# Patient Record
Sex: Male | Born: 1980 | Race: Black or African American | Hispanic: No | Marital: Single | Smoking: Never smoker
Health system: Southern US, Community
[De-identification: ages and names within clinical notes are randomized; demographics above are authoritative.]

## PROBLEM LIST (undated history)

## (undated) DIAGNOSIS — I10 Essential (primary) hypertension: Secondary | ICD-10-CM

---

## 2014-12-11 ENCOUNTER — Emergency Department (HOSPITAL_COMMUNITY)
Admission: EM | Admit: 2014-12-11 | Discharge: 2014-12-11 | Disposition: A | Discharge status: Home / Self Care | Payer: BC Managed Care – PPO | Attending: Emergency Medicine | Admitting: Emergency Medicine

## 2014-12-11 ENCOUNTER — Emergency Department (HOSPITAL_COMMUNITY): Payer: BC Managed Care – PPO

## 2014-12-11 ENCOUNTER — Encounter (HOSPITAL_COMMUNITY): Payer: Self-pay | Admitting: Emergency Medicine

## 2014-12-11 DIAGNOSIS — J069 Acute upper respiratory infection, unspecified: Secondary | ICD-10-CM | POA: Insufficient documentation

## 2014-12-11 DIAGNOSIS — R0602 Shortness of breath: Secondary | ICD-10-CM | POA: Diagnosis present

## 2014-12-11 LAB — BASIC METABOLIC PANEL
Anion gap: 5 (ref 5–15)
BUN: 13 mg/dL (ref 6–23)
CALCIUM: 9.5 mg/dL (ref 8.4–10.5)
CHLORIDE: 103 mmol/L (ref 96–112)
CO2: 28 mmol/L (ref 19–32)
CREATININE: 1.21 mg/dL (ref 0.50–1.35)
GFR calc non Af Amer: 78 mL/min — ABNORMAL LOW (ref >90)
GLUCOSE: 111 mg/dL — AB (ref 70–99)
Potassium: 3.6 mmol/L (ref 3.5–5.1)
Sodium: 136 mmol/L (ref 135–145)

## 2014-12-11 LAB — CBC WITH DIFFERENTIAL/PLATELET
BASOS ABS: 0 10*3/uL (ref 0.0–0.1)
BASOS PCT: 0 % (ref 0–1)
EOS PCT: 6 % — AB (ref 0–5)
Eosinophils Absolute: 0.5 10*3/uL (ref 0.0–0.7)
HCT: 43.5 % (ref 39.0–52.0)
HEMOGLOBIN: 14.4 g/dL (ref 13.0–17.0)
Lymphocytes Relative: 26 % (ref 12–46)
Lymphs Abs: 2.3 10*3/uL (ref 0.7–4.0)
MCH: 28.8 pg (ref 26.0–34.0)
MCHC: 33.1 g/dL (ref 30.0–36.0)
MCV: 87 fL (ref 78.0–100.0)
MONOS PCT: 4 % (ref 3–12)
Monocytes Absolute: 0.4 10*3/uL (ref 0.1–1.0)
Neutro Abs: 5.5 10*3/uL (ref 1.7–7.7)
Neutrophils Relative %: 64 % (ref 43–77)
Platelets: 228 10*3/uL (ref 150–400)
RBC: 5 MIL/uL (ref 4.22–5.81)
RDW: 13.7 % (ref 11.5–15.5)
WBC: 8.6 10*3/uL (ref 4.0–10.5)

## 2014-12-11 LAB — I-STAT TROPONIN, ED: Troponin i, poc: 0 ng/mL (ref 0.00–0.08)

## 2014-12-11 LAB — BRAIN NATRIURETIC PEPTIDE: B Natriuretic Peptide: 15.9 pg/mL (ref 0.0–100.0)

## 2014-12-11 NOTE — ED Notes (Signed)
Pt c/o SOB after playing basketball yesterday; pt sts some fatigue today and sts some sinus congestion

## 2014-12-11 NOTE — Discharge Instructions (Signed)
Upper Respiratory Infection, Adult An upper respiratory infection (URI) is also sometimes known as the common cold. The upper respiratory tract includes the nose, sinuses, throat, trachea, and bronchi. Bronchi are the airways leading to the lungs. Most people improve within 1 week, but symptoms can last up to 2 weeks. A residual cough may last even longer.  CAUSES Many different viruses can infect the tissues lining the upper respiratory tract. The tissues become irritated and inflamed and often become very moist. Mucus production is also common. A cold is contagious. You can easily spread the virus to others by oral contact. This includes kissing, sharing a glass, coughing, or sneezing. Touching your mouth or nose and then touching a surface, which is then touched by another person, can also spread the virus. SYMPTOMS  Symptoms typically develop 1 to 3 days after you come in contact with a cold virus. Symptoms vary from person to person. They may include:  Runny nose.  Sneezing.  Nasal congestion.  Sinus irritation.  Sore throat.  Loss of voice (laryngitis).  Cough.  Fatigue.  Muscle aches.  Loss of appetite.  Headache.  Low-grade fever. DIAGNOSIS  You might diagnose your own cold based on familiar symptoms, since most people get a cold 2 to 3 times a year. Your caregiver can confirm this based on your exam. Most importantly, your caregiver can check that your symptoms are not due to another disease such as strep throat, sinusitis, pneumonia, asthma, or epiglottitis. Blood tests, throat tests, and X-rays are not necessary to diagnose a common cold, but they may sometimes be helpful in excluding other more serious diseases. Your caregiver will decide if any further tests are required. RISKS AND COMPLICATIONS  You may be at risk for a more severe case of the common cold if you smoke cigarettes, have chronic heart disease (such as heart failure) or lung disease (such as asthma), or if  you have a weakened immune system. The very young and very old are also at risk for more serious infections. Bacterial sinusitis, middle ear infections, and bacterial pneumonia can complicate the common cold. The common cold can worsen asthma and chronic obstructive pulmonary disease (COPD). Sometimes, these complications can require emergency medical care and may be life-threatening. PREVENTION  The best way to protect against getting a cold is to practice good hygiene. Avoid oral or hand contact with people with cold symptoms. Wash your hands often if contact occurs. There is no clear evidence that vitamin C, vitamin E, echinacea, or exercise reduces the chance of developing a cold. However, it is always recommended to get plenty of rest and practice good nutrition. TREATMENT  Treatment is directed at relieving symptoms. There is no cure. Antibiotics are not effective, because the infection is caused by a virus, not by bacteria. Treatment may include:  Increased fluid intake. Sports drinks offer valuable electrolytes, sugars, and fluids.  Breathing heated mist or steam (vaporizer or shower).  Eating chicken soup or other clear broths, and maintaining good nutrition.  Getting plenty of rest.  Using gargles or lozenges for comfort.  Controlling fevers with ibuprofen or acetaminophen as directed by your caregiver.  Increasing usage of your inhaler if you have asthma. Zinc gel and zinc lozenges, taken in the first 24 hours of the common cold, can shorten the duration and lessen the severity of symptoms. Pain medicines may help with fever, muscle aches, and throat pain. A variety of non-prescription medicines are available to treat congestion and runny nose. Your caregiver   can make recommendations and may suggest nasal or lung inhalers for other symptoms.  HOME CARE INSTRUCTIONS   Only take over-the-counter or prescription medicines for pain, discomfort, or fever as directed by your  caregiver.  Use a warm mist humidifier or inhale steam from a shower to increase air moisture. This may keep secretions moist and make it easier to breathe.  Drink enough water and fluids to keep your urine clear or pale yellow.  Rest as needed.  Return to work when your temperature has returned to normal or as your caregiver advises. You may need to stay home longer to avoid infecting others. You can also use a face mask and careful hand washing to prevent spread of the virus. SEEK MEDICAL CARE IF:   After the first few days, you feel you are getting worse rather than better.  You need your caregiver's advice about medicines to control symptoms.  You develop chills, worsening shortness of breath, or brown or red sputum. These may be signs of pneumonia.  You develop yellow or brown nasal discharge or pain in the face, especially when you bend forward. These may be signs of sinusitis.  You develop a fever, swollen neck glands, pain with swallowing, or white areas in the back of your throat. These may be signs of strep throat. SEEK IMMEDIATE MEDICAL CARE IF:   You have a fever.  You develop severe or persistent headache, ear pain, sinus pain, or chest pain.  You develop wheezing, a prolonged cough, cough up blood, or have a change in your usual mucus (if you have chronic lung disease).  You develop sore muscles or a stiff neck. Document Released: 03/11/2001 Document Revised: 12/08/2011 Document Reviewed: 12/21/2013 Exit[employer] Patient Information 2015 [employer]. This information is not intended to replace advice given to you by your health care provider. Make sure you discuss any questions you have with your health care provider.  

## 2014-12-11 NOTE — ED Provider Notes (Signed)
CSN: [id number]     Arrival date & time 12/11/14  1035 History   First MD Initiated Contact with Patient 12/11/14 1219     Chief Complaint  Patient presents with  . Shortness of Breath  . Fatigue     (Consider location/radiation/quality/duration/timing/severity/associated sxs/prior Treatment) Patient is a 34 y.o. male presenting with URI. The history is provided by the patient.  URI Presenting symptoms: congestion, cough and fatigue   Presenting symptoms: no fever and no sore throat   Severity:  Moderate Onset quality:  Gradual Duration:  3 days Timing:  Constant Progression:  Worsening Chronicity:  New Relieved by:  None tried Exacerbated by: exertion. Ineffective treatments:  None tried Associated symptoms: no arthralgias, no myalgias and no wheezing   Risk factors: no chronic cardiac disease, no chronic kidney disease, no chronic respiratory disease and no diabetes mellitus     History reviewed. No pertinent past medical history. History reviewed. No pertinent past surgical history. History reviewed. No pertinent family history. History  Substance Use Topics  . Smoking status: Never Smoker   . Smokeless tobacco: Not on file  . Alcohol Use: No    Review of Systems  Constitutional: Positive for fatigue. Negative for fever.  HENT: Positive for congestion. Negative for sore throat.   Respiratory: Positive for cough. Negative for wheezing.   Musculoskeletal: Negative for myalgias and arthralgias.  All other systems reviewed and are negative.     Allergies  Shellfish allergy  Home Medications   Prior to Admission medications   Medication Sig Start Date End Date Taking? Authorizing Provider  ibuprofen (ADVIL,MOTRIN) 200 MG tablet Take 400 mg by mouth every 6 (six) hours as needed for mild pain or moderate pain.   Yes Historical Provider, MD   BP 127/79 mmHg  Pulse 95  Temp(Src) 98.7 F (37.1 C) (Oral)  Resp 17  Ht  (1.905 m)  Wt 299 lb 1 oz (135.654 kg)   BMI 37.38 kg/m2  SpO2 95% Physical Exam  Constitutional: He is oriented to person, place, and time. He appears well-developed and well-nourished. No distress.  HENT:  Head: Normocephalic and atraumatic.  Mouth/Throat: Oropharynx is clear and moist. No oropharyngeal exudate.  Nasal congestion with nasal voice. TMs clear bilaterally  Eyes: Conjunctivae and EOM are normal. Pupils are equal, round, and reactive to light.  Neck: Normal range of motion. Neck supple.  Cardiovascular: Normal rate, regular rhythm, normal heart sounds and intact distal pulses.  Exam reveals no gallop and no friction rub.   No murmur heard. Pulmonary/Chest: Effort normal and breath sounds normal. No respiratory distress. He has no wheezes. He has no rales.  Abdominal: Soft. He exhibits no distension and no mass. There is no tenderness. There is no rebound and no guarding.  Musculoskeletal: Normal range of motion. He exhibits no edema or tenderness.  Lymphadenopathy:    He has cervical adenopathy (non-tender).  Neurological: He is alert and oriented to person, place, and time. No cranial nerve deficit.  Skin: Skin is warm and dry. No rash noted. He is not diaphoretic.  Psychiatric: He has a normal mood and affect. His behavior is normal. Judgment and thought content normal.  Nursing note and vitals reviewed.   ED Course  Procedures (including critical care time) Labs Review Labs Reviewed  BASIC METABOLIC PANEL - Abnormal; Notable for the following:    Glucose, Bld 111 (*)    GFR calc non Af Amer 78 (*)    All other components within normal  limits  CBC WITH DIFFERENTIAL/PLATELET - Abnormal; Notable for the following:    Eosinophils Relative 6 (*)    All other components within normal limits  BRAIN NATRIURETIC PEPTIDE  I-STAT TROPOININ, ED    Imaging Review Dg Chest 2 View  12/11/2014   CLINICAL DATA:  Shortness of breath since yesterday.  EXAM: CHEST  2 VIEW  COMPARISON:  None.  FINDINGS: The heart size  and mediastinal contours are within normal limits. Both lungs are clear. No pneumothorax or pleural effusion is noted. The visualized skeletal structures are unremarkable.  IMPRESSION: No active cardiopulmonary disease.   Electronically Signed   By: Lupita RaiderJames  Green Jr, M.D.   On: 12/11/2014 12:24     EKG Interpretation   Date/Time:  Monday December 11 2014 11:21:59 EDT Ventricular Rate:  88 PR Interval:  152 QRS Duration: 94 QT Interval:  370 QTC Calculation: 447 R Axis:   82 Text Interpretation:  Normal sinus rhythm Normal ECG Confirmed by WARD,   DO, KRISTEN ([id number]) on 12/11/2014 12:52:10 PM      MDM   Final diagnoses:  Viral URI   34 year old male presents with URI symptoms and shortness of breath. Increased fatigue and shortness of breath with minimal exertion for the last 3 days. Also with sinus congestion, mild cough. Denies chest pain, worsening swelling. Denies fevers.  On arrival the patient is afebrile and hemodynamically stable. Lungs are clear to auscultation bilaterally has no increased work of breathing or hypoxia. Presentation is consistent with a viral URI. Workup obtained prior to my examination with a negative chest x-ray, EKG was normal sinus rhythm and no ischemic changes, and basic labs including troponin all within normal limits. Presentation is not concerning for emergent etiology due to the shortness of breath and more likely secondary to viral URI. We will treat symptomatically and refer back to PCP for further evaluation if symptoms persist following this illness.    Dorna LeitzAlex Ariyel Jeangilles, MD 12/11/14 1314  Layla MawKristen N Ward, DO 12/11/14 [phone]

## 2017-01-29 ENCOUNTER — Encounter (HOSPITAL_COMMUNITY): Payer: Self-pay

## 2017-01-29 ENCOUNTER — Emergency Department (HOSPITAL_COMMUNITY)
Admission: EM | Admit: 2017-01-29 | Discharge: 2017-01-29 | Disposition: A | Discharge status: Home / Self Care | Payer: BC Managed Care – PPO | Attending: Emergency Medicine | Admitting: Emergency Medicine

## 2017-01-29 ENCOUNTER — Emergency Department (HOSPITAL_COMMUNITY): Payer: BC Managed Care – PPO

## 2017-01-29 DIAGNOSIS — R200 Anesthesia of skin: Secondary | ICD-10-CM | POA: Diagnosis present

## 2017-01-29 LAB — CBC WITH DIFFERENTIAL/PLATELET
Basophils Absolute: 0 10*3/uL (ref 0.0–0.1)
Basophils Relative: 0 %
EOS PCT: 3 %
Eosinophils Absolute: 0.2 10*3/uL (ref 0.0–0.7)
HCT: 40.5 % (ref 39.0–52.0)
Hemoglobin: 13.3 g/dL (ref 13.0–17.0)
LYMPHS PCT: 30 %
Lymphs Abs: 2 10*3/uL (ref 0.7–4.0)
MCH: 28.9 pg (ref 26.0–34.0)
MCHC: 32.8 g/dL (ref 30.0–36.0)
MCV: 87.9 fL (ref 78.0–100.0)
MONO ABS: 0.5 10*3/uL (ref 0.1–1.0)
MONOS PCT: 7 %
Neutro Abs: 4.2 10*3/uL (ref 1.7–7.7)
Neutrophils Relative %: 60 %
Platelets: 246 10*3/uL (ref 150–400)
RBC: 4.61 MIL/uL (ref 4.22–5.81)
RDW: 13.2 % (ref 11.5–15.5)
WBC: 6.9 10*3/uL (ref 4.0–10.5)

## 2017-01-29 LAB — BASIC METABOLIC PANEL
Anion gap: 8 (ref 5–15)
BUN: 10 mg/dL (ref 6–20)
CO2: 25 mmol/L (ref 22–32)
Calcium: 9.4 mg/dL (ref 8.9–10.3)
Chloride: 105 mmol/L (ref 101–111)
Creatinine, Ser: 1.3 mg/dL — ABNORMAL HIGH (ref 0.61–1.24)
GFR calc Af Amer: >60 mL/min (ref >60)
GLUCOSE: 80 mg/dL (ref 65–99)
Potassium: 3.7 mmol/L (ref 3.5–5.1)
Sodium: 138 mmol/L (ref 135–145)

## 2017-01-29 MED ORDER — LORAZEPAM 1 MG PO TABS: 1.0000 mg | ORAL_TABLET | Freq: Once | ORAL | Status: DC

## 2017-01-29 NOTE — ED Triage Notes (Signed)
Pt reports he was writing this morning and felt weakness/numbness in his right arm. No unilateral weakness noted. Pt reports he has had recent stress.

## 2017-01-29 NOTE — ED Notes (Signed)
Patient transported to CT 

## 2017-01-29 NOTE — Discharge Instructions (Signed)
CT and MRI are normal. Your kidney function is mildly elevated. Make sure you drink plenty of fluids. Follow up with PCP tomorrow as discussed. Have given him a referral to neurology. Return to the ED if he develops any worsening symptoms.

## 2017-01-29 NOTE — ED Provider Notes (Signed)
[facility]-EMERGENCY DEPT Provider Note   CSN: [id number] Arrival date & time: 01/29/17  1314     History   Chief Complaint Chief Complaint  Patient presents with  . Numbness    HPI Marc Gibson is a 36 y.o. male.  HPI 36 year old African-American male with no significant past medical history presents to the ED today with complaints of right arm tightness, numbness, weakness. The patient states that he is currently under a lot of stress at work. Has had several stressful events this week. 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 States that it felt numb and was shaking. Patient states that he stopped and thought that it was self resolved. Had one of his colleagues watch him right and again noticed that his hand was tremorous. Patient reports that his arm feels "tight". Denies any tingling. Patient states he's been having intermittent headaches for the past couple of days. Denies maximal or acute onset. They have self resolved. Denies any headache today. Denies any vision changes. Denies any other symptoms including difficulties with speech, facial asymmetry, difficulties walking. Does have a history of headaches. Wife at bedside was concern for possible stroke or complex migraines. Patient denies any other complaints at this time. History reviewed. No pertinent past medical history.  There are no active problems to display for this patient.   History reviewed. No pertinent surgical history.     Home Medications    Prior to Admission medications   Medication Sig Start Date End Date Taking? Authorizing Provider  ibuprofen (ADVIL,MOTRIN) 200 MG tablet Take 400 mg by mouth every 6 (six) hours as needed for mild pain or moderate pain.    Historical Provider, MD    Family History History reviewed. No pertinent family history.  Social History Social History  Substance Use Topics  . Smoking status: Never  Smoker  . Smokeless tobacco: Never Used  . Alcohol use No     Allergies   Shellfish allergy   Review of Systems Review of Systems  HENT: Negative for congestion.   Respiratory: Negative for cough and shortness of breath.   Cardiovascular: Negative for chest pain.  Gastrointestinal: Negative for abdominal pain, diarrhea, nausea and vomiting.  Skin: Negative.   Neurological: Positive for weakness, numbness and headaches. Negative for dizziness and light-headedness.     Physical Exam Updated Vital Signs BP (!) 150/106   Pulse 66   Temp 98.1 F (36.7 C) (Oral)   Resp 16   Ht 6\' 3"  (1.905 m)   Wt 129.3 kg   SpO2 99%   BMI 35.62 kg/m   Physical Exam  Constitutional: He is oriented to person, place, and time. He appears well-developed and well-nourished. No distress.  Patient appears anxious. Nontoxic-appearing. No acute distress. Resting comfortably on the bed.  HENT:  Head: Normocephalic and atraumatic.  Mouth/Throat: Oropharynx is clear and moist.  Eyes: Conjunctivae and EOM are normal. Pupils are equal, round, and reactive to light. Right eye exhibits no discharge. Left eye exhibits no discharge. No scleral icterus.  Neck: Normal range of motion. Neck supple. No thyromegaly present.  Cardiovascular: Normal rate, regular rhythm, normal heart sounds and intact distal pulses.  Exam reveals no gallop and no friction rub.   No murmur heard. Pulmonary/Chest: Effort normal and breath sounds normal. No respiratory distress. He has no rales. He exhibits no tenderness.  Abdominal: Soft. Bowel sounds are normal. He exhibits no distension. There  is no tenderness. There is no rebound and no guarding.  Musculoskeletal: Normal range of motion.  Lymphadenopathy:    He has no cervical adenopathy.  Neurological: He is alert and oriented to person, place, and time.  The patient is alert, attentive, and oriented x 3. Speech is clear. Cranial nerve II-VII grossly intact. Negative pronator  drift. Sensation intact. Strength 5/5 in all extremities. Reflexes 2+ and symmetric at biceps, triceps, knees, and ankles. Rapid alternating movement and fine finger movements intact. Romberg is absent. Posture and gait normal.  Proprioception are intact. Intact to sharp dull. Point discrimination normal.   Skin: Skin is warm and dry. Capillary refill takes less than 2 seconds.  Psychiatric: His mood appears anxious.  Nursing note and vitals reviewed.    ED Treatments / Results  Labs (all labs ordered are listed, but only abnormal results are displayed) Labs Reviewed  BASIC METABOLIC PANEL - Abnormal; Notable for the following:       Result Value   Creatinine, Ser 1.30 (*)    All other components within normal limits  CBC WITH DIFFERENTIAL/PLATELET    EKG  EKG Interpretation None       Radiology Ct Head Wo Contrast  Result Date: 01/29/2017 CLINICAL DATA:  Weakness and numbness in the right arm EXAM: CT HEAD WITHOUT CONTRAST TECHNIQUE: Contiguous axial images were obtained from the base of the skull through the vertex without intravenous contrast. COMPARISON:  None. FINDINGS: Brain: No evidence of acute infarction, hemorrhage, hydrocephalus, extra-axial collection or mass lesion/mass effect. Vascular: No hyperdense vessel or unexpected calcification. Skull: Normal. Negative for fracture or focal lesion. Sinuses/Orbits: No acute finding. Other: None. IMPRESSION: No acute abnormality noted. Electronically Signed   By: Alcide Clever M.D.   On: 01/29/2017 14:35    Procedures Procedures (including critical care time)  Medications Ordered in ED Medications  LORazepam (ATIVAN) tablet 1 mg (1 mg Oral Refused 01/29/17 1517)     Initial Impression / Assessment and Plan / ED Course  I have reviewed the triage vital signs and the nursing notes.  Pertinent labs & imaging results that were available during my care of the patient were reviewed by me and considered in my medical decision  making (see chart for details).     The patient resents to the ED with complaints of right arm numbness/tightness with intermittent headaches. Patient is neurovascularly intact. No focal neuro deficits noted. CT of head was normal. Labs are baseline. Creatinine elevated at 1.3 which seems to be at baseline for patient. Last creatinine was 1.24. Patient able to tolerate by mouth fluids. Had been given by mouth fluids in the ED. Will need follow-up with PCP for recheck of creatinine. Offer Ativan to patient due to his  anxiety however he and wife refused. Given patient's symptoms MRI was ordered. MRI returned that showed no acute abnormalities. So the patient's symptoms are likely due to anxiety versus complex migraines. We'll give follow-up to neurology. All symptoms have resolved. No ongoing symptoms. Encouraged follow-up with PCP for kidney function rechecked and to increase fluid intake. Pt is hemodynamically stable, in NAD, & able to ambulate in the ED. Pain has been managed & pt has no complaints prior to dc. Pt is comfortable with above plan and is stable for discharge at this time. All questions were answered prior to disposition. Strict return precautions for f/u to the ED were discussed. Patient discussed with Dr. Juleen China who is agreeable to above plan. Patient states she'll follow up with  his primary care doctor tomorrow.  Final Clinical Impressions(s) / ED Diagnoses   Final diagnoses:  Right arm numbness    New Prescriptions New Prescriptions   No medications on file     Rise MuKenneth T Tranesha Lessner, PA-C 01/29/17 1630    Raeford RazorKohut, Stephen, MD 02/02/17 [phone]

## 2017-01-29 NOTE — ED Notes (Signed)
Pt returned from MRI °

## 2018-11-02 IMAGING — CT CT HEAD W/O CM
4 series · 16 of 47 positions shown, 18 images · non-contrast
Comparison: None.

CLINICAL DATA: Weakness and numbness in the right arm

EXAM:
CT HEAD WITHOUT CONTRAST
TECHNIQUE: Contiguous axial images were obtained from the base of the skull
through the vertex without intravenous contrast.

[Series 3: head without · axial · non-contrast · 0.45mm/px · z∈[-43,+77]mm · 7 of 32 slices shown, 9 images]
[im 4/32  brain]
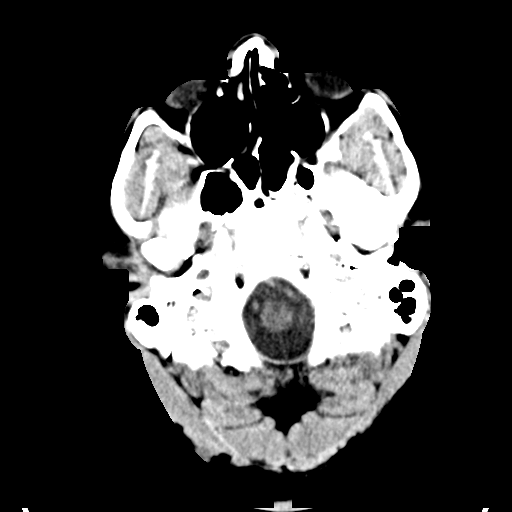
[im 4/32  bone]
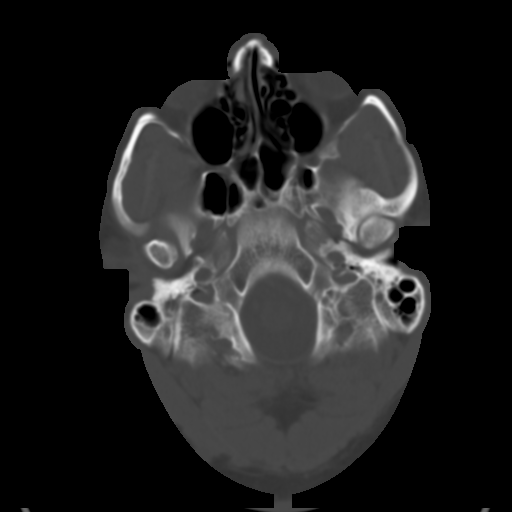
[im 8/32  brain]
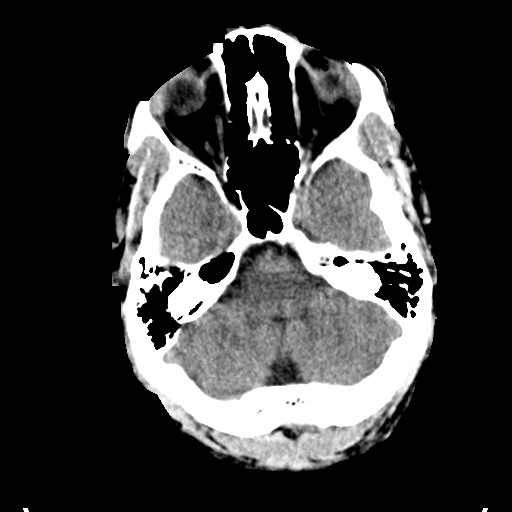
[im 12/32  brain]
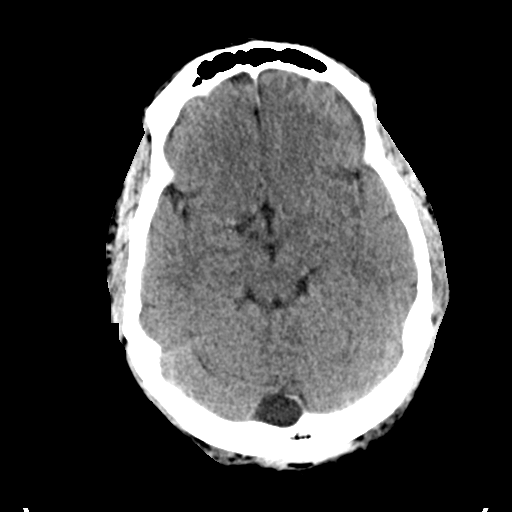
[im 16/32  brain]
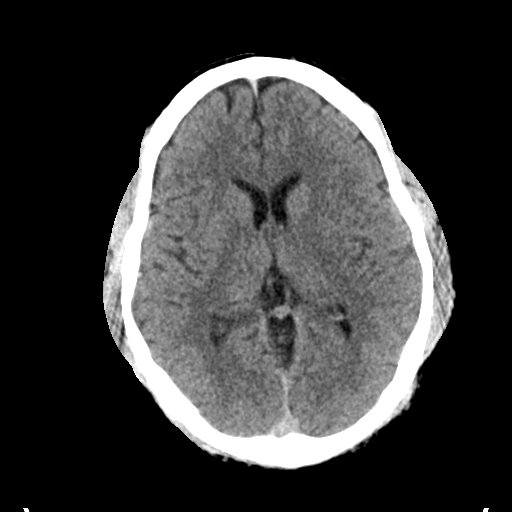
[im 20/32  brain]
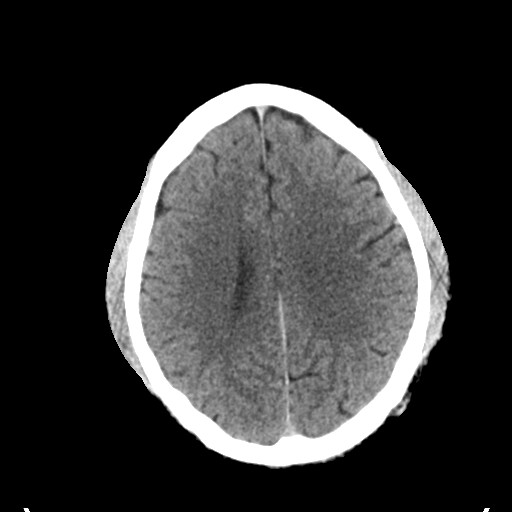
[im 20/32  bone]
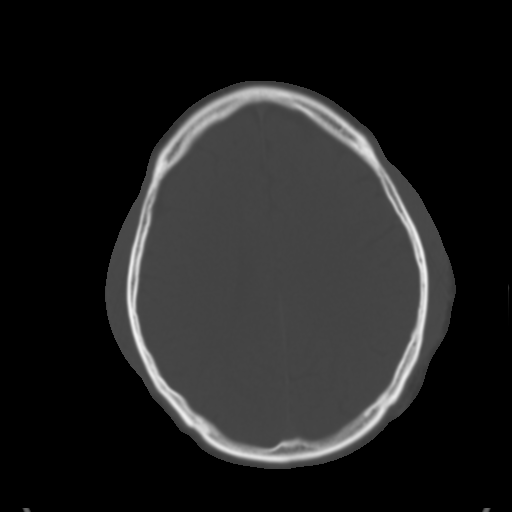
[im 24/32  brain]
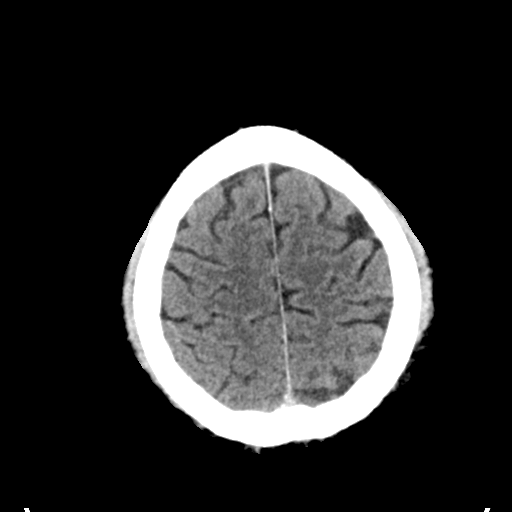
[im 28/32  brain]
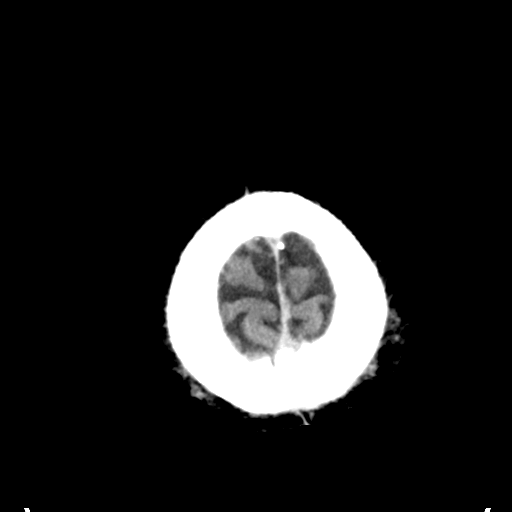

[Series 4: head bone · axial · 0.45mm/px · z∈[-44,-12]mm · 3 of 80 slices shown]
[im 8/80  bone]
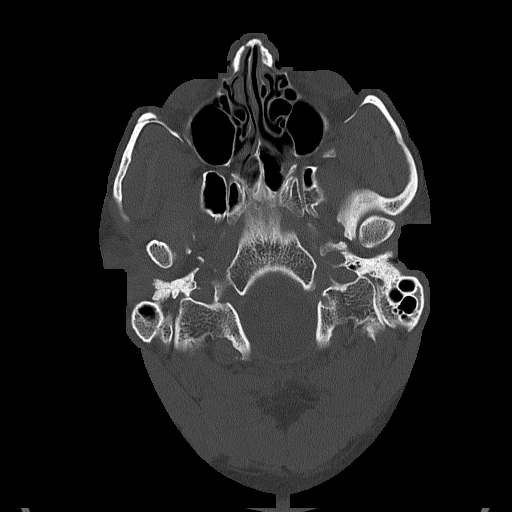
[im 16/80  bone]
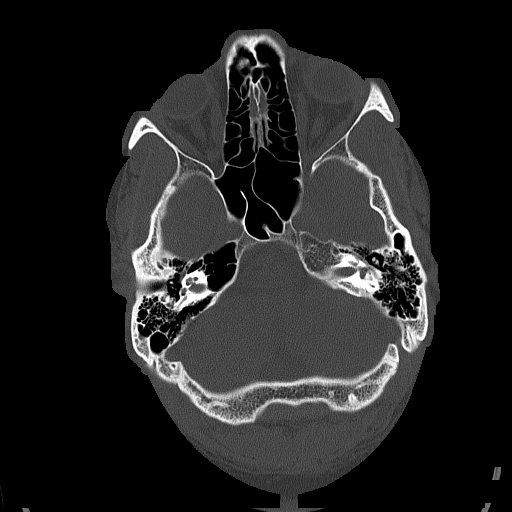
[im 24/80  bone]
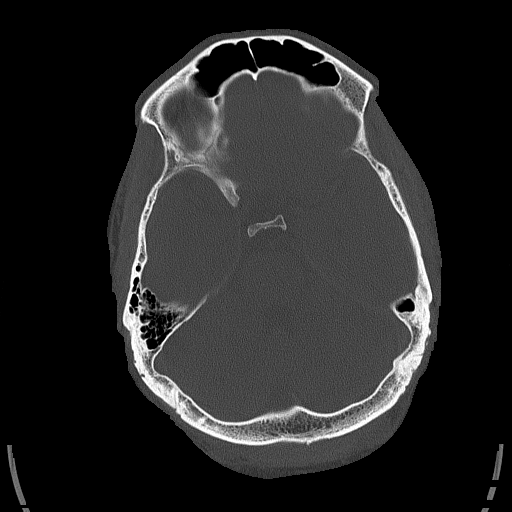

[Series 5: head without cor · coronal · non-contrast · 0.31mm/px · 3 of 74 slices shown]
[im 25/74  brain]
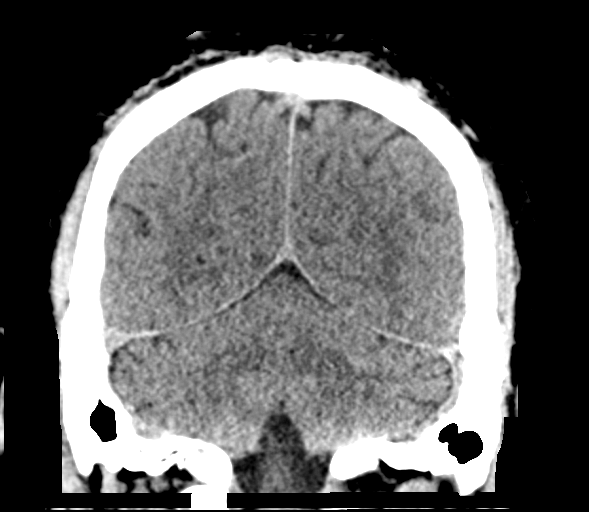
[im 33/74  brain]
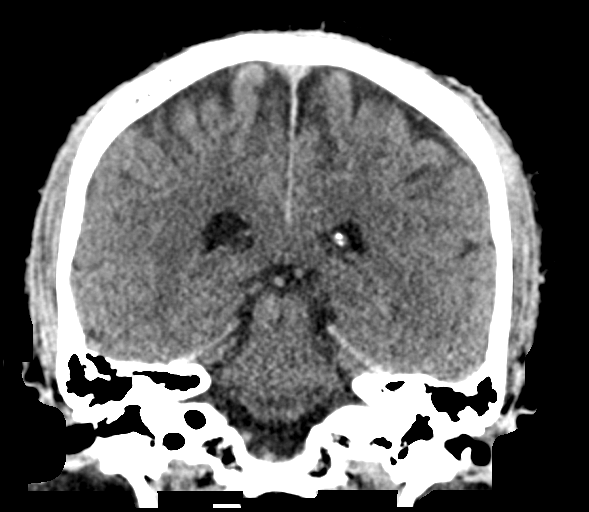
[im 41/74  brain]
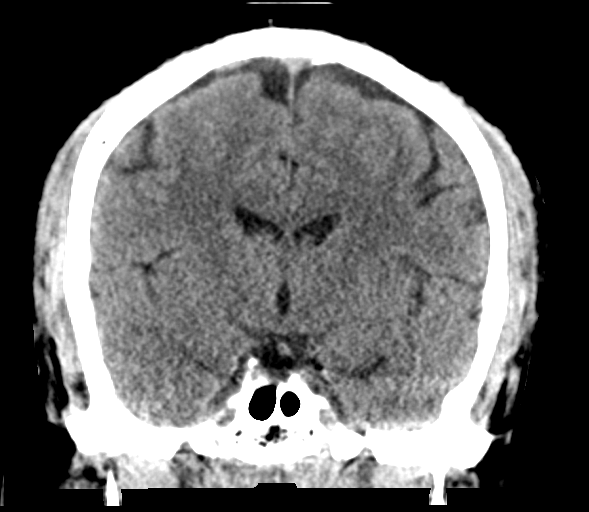

[Series 6: head without sag · sagittal · non-contrast · 0.33mm/px · 3 of 61 slices shown]
[im 21/61  brain]
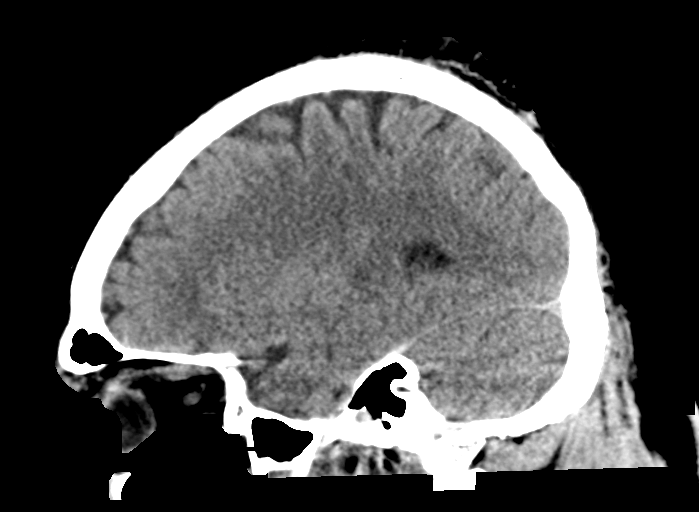
[im 31/61  brain]
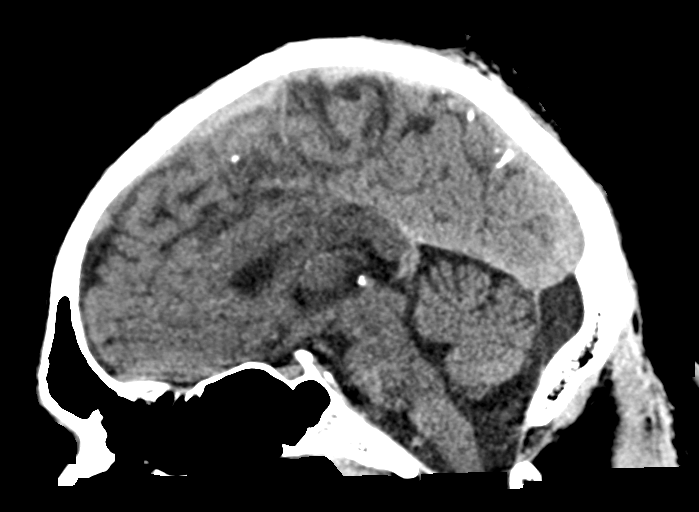
[im 41/61  brain]
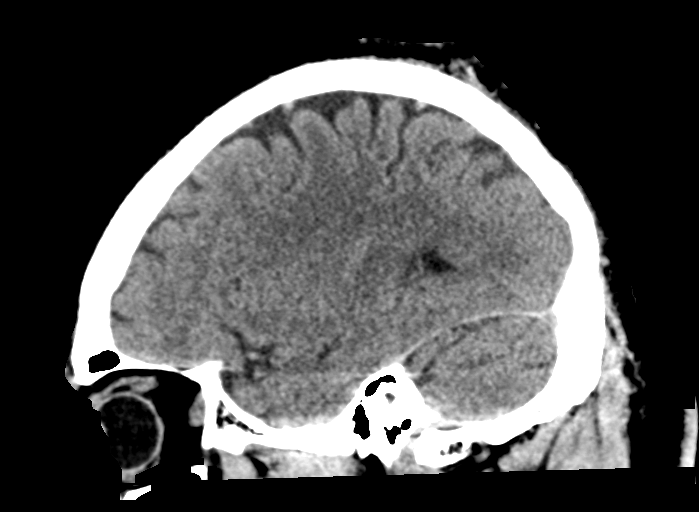

[16 of 47 positions shown; findings below may reference images not displayed]

FINDINGS: Brain: No evidence of acute infarction, hemorrhage, hydrocephalus,
extra-axial collection or mass lesion/mass effect.

Vascular: No hyperdense vessel or unexpected calcification.

Skull: Normal. Negative for fracture or focal lesion.

Sinuses/Orbits: No acute finding.

Other: None.
IMPRESSION: No acute abnormality noted.

## 2019-09-15 ENCOUNTER — Ambulatory Visit (HOSPITAL_COMMUNITY)
Admission: EM | Admit: 2019-09-15 | Discharge: 2019-09-15 | Disposition: A | Discharge status: Home / Self Care | Payer: BC Managed Care – PPO | Attending: Family Medicine | Admitting: Family Medicine

## 2019-09-15 ENCOUNTER — Other Ambulatory Visit: Payer: Self-pay

## 2019-09-15 ENCOUNTER — Encounter (HOSPITAL_COMMUNITY): Payer: Self-pay

## 2019-09-15 DIAGNOSIS — R05 Cough: Secondary | ICD-10-CM

## 2019-09-15 DIAGNOSIS — R059 Cough, unspecified: Secondary | ICD-10-CM

## 2019-09-15 NOTE — ED Triage Notes (Signed)
Patient presents to Urgent Care with complaints of fatigue and cough since four days ago. Patient reports he was [facility] and has not yet received results, is requesting rapid test, qualifications for rapid test discussed at length.Marc Gibson

## 2019-09-15 NOTE — Discharge Instructions (Signed)
LWBS 

## 2019-09-15 NOTE — ED Notes (Signed)
After being told the pt did not qualify for rapid covid testing, he left. Pt did not want to be seen by a provider, states he wanted to know if he was covid negative so he could attend a secret [employer] at work tomorrow.

## 2024-10-27 ENCOUNTER — Encounter (HOSPITAL_COMMUNITY): Payer: Self-pay

## 2024-10-27 ENCOUNTER — Ambulatory Visit (HOSPITAL_COMMUNITY)
Admission: EM | Admit: 2024-10-27 | Discharge: 2024-10-27 | Disposition: A | Discharge status: Home / Self Care | Attending: Student | Admitting: Student

## 2024-10-27 DIAGNOSIS — L02214 Cutaneous abscess of groin: Secondary | ICD-10-CM

## 2024-10-27 HISTORY — DX: Essential (primary) hypertension: I10

## 2024-10-27 MED ORDER — DOXYCYCLINE HYCLATE 100 MG PO CAPS: 100.0000 mg | ORAL_CAPSULE | Freq: Two times a day (BID) | ORAL | 0 refills | Status: AC

## 2024-10-27 NOTE — ED Triage Notes (Signed)
 Patient reports tht he has had an abscess tot he right inner thigh x 4 days.  Patient has been using Vaseline and Hydrocortisone for one day only.

## 2024-10-27 NOTE — Discharge Instructions (Signed)
-  Doxycycline  twice daily for 7 days.  Make sure to wear sunscreen while spending time outside while on this medication as it can increase your chance of sunburn. You can take this medication with food if you have a sensitive stomach. -Continue warm compresses 2-3 times daily.  Alternatively, you can take a warm bath.  This will help the remaining pus come out of the abscess. -Wash the abscess with soap and water only.  Avoid hydrogen peroxide or alcohol to cleanse. -Your symptoms should be getting better instead of worse after about 48 hours, so if your symptoms are worsening, follow-up for additional care.  If you develop a fever, seek additional care.

## 2024-10-27 NOTE — ED Provider Notes (Signed)
 " [facility]    CSN: [id number] Arrival date & time: 10/27/24  0801      History   Chief Complaint Chief Complaint  Patient presents with   Abscess    HPI Marc Gibson is a 44 y.o. male presenting with groin abscess. States he first noticed swelling and discomfort in the right groin about 5 days ago, it got progressively worse, until last night when he applied a warm compress and the pain improved.  Has attempted Vaseline and hydrocortisone over the area, without relief.  Denies fevers.  HPI  Past Medical History:  Diagnosis Date   Hypertension     There are no active problems to display for this patient.   History reviewed. No pertinent surgical history.     Home Medications    Prior to Admission medications  Medication Sig Start Date End Date Taking? Authorizing Provider  doxycycline  (VIBRAMYCIN ) 100 MG capsule Take 1 capsule (100 mg total) by mouth 2 (two) times daily for 7 days. 10/27/24 11/03/24 Yes Voyd Groft E, PA-C  ibuprofen (ADVIL,MOTRIN) 200 MG tablet Take 400 mg by mouth every 6 (six) hours as needed for mild pain or moderate pain.    [provider]    Family History Family History  Problem Relation Age of Onset   Healthy Mother    Healthy Father     Social History Social History[1]   Allergies   Shellfish allergy   Review of Systems Review of Systems  Skin:        Abscess     Physical Exam Triage Vital Signs ED Triage Vitals  Encounter Vitals Group     BP 10/27/24 0904 (!) 154/107     Girls Systolic BP Percentile --      Girls Diastolic BP Percentile --      Boys Systolic BP Percentile --      Boys Diastolic BP Percentile --      Pulse Rate 10/27/24 0904 94     Resp 10/27/24 0904 16     Temp 10/27/24 0904 98.1 F (36.7 C)     Temp Source 10/27/24 0904 Oral     SpO2 10/27/24 0904 97 %     Weight --      Height --      Head Circumference --      Peak Flow --      Pain Score 10/27/24 0906 8     Pain  Loc --      Pain Education --      Exclude from Growth Chart --    No data found.  Updated Vital Signs BP (!) 154/107 (BP Location: Left Arm)   Pulse 94   Temp 98.1 F (36.7 C) (Oral)   Resp 16   SpO2 97%   Visual Acuity Right Eye Distance:   Left Eye Distance:   Bilateral Distance:    Right Eye Near:   Left Eye Near:    Bilateral Near:     Physical Exam Vitals reviewed.  Constitutional:      General: He is not in acute distress.    Appearance: Normal appearance. He is not ill-appearing.  HENT:     Head: Normocephalic and atraumatic.  Pulmonary:     Effort: Pulmonary effort is normal.  Genitourinary:    Comments: Chaperone: Dorothe There is a 1 x 3 cm abscess with induration and central fluctuance, at the crease of the thigh and Right buttock, about 4 cm distal to the testicles.  There is spontaneous drainage. Neurological:     General: No focal deficit present.     Mental Status: He is alert and oriented to person, place, and time.  Psychiatric:        Mood and Affect: Mood normal.        Behavior: Behavior normal.        Thought Content: Thought content normal.        Judgment: Judgment normal.      UC Treatments / Results  Labs (all labs ordered are listed, but only abnormal results are displayed) Labs Reviewed - No data to display  EKG   Radiology No results found.  Procedures Procedures (including critical care time)  Medications Ordered in UC Medications - No data to display  Initial Impression / Assessment and Plan / UC Course  I have reviewed the triage vital signs and the nursing notes.  Pertinent labs & imaging results that were available during my care of the patient were reviewed by me and considered in my medical decision making (see chart for details).     Patient is a pleasant 44 y.o. male presenting with abscess R groin. The patient is afebrile and nontachycardic.  Antipyretic has not been administered today. The abscess is already  spontaneously draining. Continue warm compresses. Doxycycline  sent. Return precautions as below.   Final Clinical Impressions(s) / UC Diagnoses   Final diagnoses:  Abscess of groin, right     Discharge Instructions      -Doxycycline  twice daily for 7 days.  Make sure to wear sunscreen while spending time outside while on this medication as it can increase your chance of sunburn. You can take this medication with food if you have a sensitive stomach. -Continue warm compresses 2-3 times daily.  Alternatively, you can take a warm bath.  This will help the remaining pus come out of the abscess. -Wash the abscess with soap and water only.  Avoid hydrogen peroxide or alcohol to cleanse. -Your symptoms should be getting better instead of worse after about 48 hours, so if your symptoms are worsening, follow-up for additional care.  If you develop a fever, seek additional care.   ED Prescriptions     Medication Sig Dispense Auth. Provider   doxycycline  (VIBRAMYCIN ) 100 MG capsule Take 1 capsule (100 mg total) by mouth 2 (two) times daily for 7 days. 14 capsule Zen Cedillos E, PA-C      PDMP not reviewed this encounter.    [1]  Social History Tobacco Use   Smoking status: Never   Smokeless tobacco: Never  Vaping Use   Vaping status: Never Used  Substance Use Topics   Alcohol use: Yes    Comment: occasionally   Drug use: No     Arlyss Leita BRAVO, PA-C 10/27/24 [id number]  "
